# Patient Record
Sex: Female | Born: 2000 | Hispanic: Yes | Marital: Single
Health system: Northeastern US, Academic
[De-identification: ages and names within clinical notes are randomized; demographics above are authoritative.]

---
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
## 2021-05-07 ENCOUNTER — Encounter: Admit: 2021-05-07 | Payer: PRIVATE HEALTH INSURANCE | Attending: Emergency Medicine

## 2021-05-07 ENCOUNTER — Inpatient Hospital Stay: Admit: 2021-05-07 | Discharge: 2021-05-07 | Payer: MEDICAID

## 2021-05-07 LAB — CBC WITH AUTO DIFFERENTIAL
BKR WAM ABSOLUTE IMMATURE GRANULOCYTES.: 0 x 1000/ÂµL (ref 0.00–0.30)
BKR WAM ABSOLUTE LYMPHOCYTE COUNT.: 2.43 x 1000/ÂµL (ref 0.60–3.70)
BKR WAM ABSOLUTE NRBC (2 DEC): 0 x 1000/ÂµL (ref 0.00–1.00)
BKR WAM ANALYZER ANC: 2.4 x 1000/ÂµL (ref 2.00–7.60)
BKR WAM BASOPHIL ABSOLUTE COUNT.: 0.07 x 1000/ÂµL (ref 0.00–1.00)
BKR WAM BASOPHILS: 1.2 % (ref 0.0–1.4)
BKR WAM EOSINOPHIL ABSOLUTE COUNT.: 0.75 x 1000/ÂµL (ref 0.00–1.00)
BKR WAM EOSINOPHILS: 12.5 % — ABNORMAL HIGH (ref 0.0–5.0)
BKR WAM HEMATOCRIT (2 DEC): 42.3 % (ref 35.00–45.00)
BKR WAM HEMOGLOBIN: 13.7 g/dL (ref 11.7–15.5)
BKR WAM IMMATURE GRANULOCYTES: 0 % (ref 0.0–1.0)
BKR WAM LYMPHOCYTES: 40.6 % (ref 17.0–50.0)
BKR WAM MCH (PG): 30.8 pg (ref 27.0–33.0)
BKR WAM MCHC: 32.4 g/dL (ref 31.0–36.0)
BKR WAM MCV: 95.1 fL (ref 80.0–100.0)
BKR WAM MONOCYTE ABSOLUTE COUNT.: 0.33 x 1000/ÂµL (ref 0.00–1.00)
BKR WAM MONOCYTES: 5.5 % (ref 4.0–12.0)
BKR WAM MPV: 11.6 fL (ref 8.0–12.0)
BKR WAM NEUTROPHILS: 40.2 % (ref 39.0–72.0)
BKR WAM NUCLEATED RED BLOOD CELLS: 0 % (ref 0.0–1.0)
BKR WAM PLATELETS: 253 x1000/ÂµL (ref 150–420)
BKR WAM RDW-CV: 13.1 % (ref 11.0–15.0)
BKR WAM RED BLOOD CELL COUNT.: 4.45 M/ÂµL (ref 4.00–6.00)
BKR WAM WHITE BLOOD CELL COUNT: 6 x1000/ÂµL (ref 4.0–11.0)

## 2021-05-07 LAB — BASIC METABOLIC PANEL
BKR ANION GAP: 11 (ref 7–17)
BKR BLOOD UREA NITROGEN: 10 mg/dL (ref 6–20)
BKR BUN / CREAT RATIO: 13 (ref 8.0–23.0)
BKR CALCIUM: 9.4 mg/dL (ref 8.8–10.2)
BKR CHLORIDE: 104 mmol/L (ref 98–107)
BKR CO2: 24 mmol/L (ref 20–30)
BKR CREATININE: 0.77 mg/dL (ref 0.40–1.30)
BKR EGFR, CREATININE (CKD-EPI 2021): >60 mL/min/{1.73_m2} (ref >=60)
BKR GLUCOSE: 91 mg/dL (ref 70–100)
BKR POTASSIUM: 4 mmol/L (ref 3.3–5.3)
BKR SODIUM: 139 mmol/L (ref 136–144)

## 2021-05-07 LAB — HCG, QUANTITATIVE     (BH GH LMW YH): BKR HCG, QUANTITATIVE: <1 m[IU]/mL

## 2021-05-07 MED ORDER — ACETAMINOPHEN 325 MG TABLET
325 mg | Freq: Once | ORAL | Status: CP
Start: 2021-05-07 — End: ?
  Administered 2021-05-07: 16:00:00 325 mg via ORAL

## 2021-05-07 MED ORDER — METRONIDAZOLE 500 MG TABLET
500 mg | ORAL_TABLET | Freq: Three times a day (TID) | ORAL | 1 refills | Status: AC
Start: 2021-05-07 — End: ?

## 2021-05-07 NOTE — ED Triage Note
I evaluated this patient as the Physician in triage and completed a medical screening examination.  Patient will need further care.  Initial care orders will be placed as appropriate.Alexis Goodell, DO9:13 AM

## 2021-05-07 NOTE — ED Notes
10:07 AM Patient here with c/o epigastric pain for about 4 months, now rates her pain at a 9/10. She also not being able to eat for 8 weeks and now vomiting for 5 weeks. Patient states that she gets hungry, puts the food in her mouth, chews it but then she is not able to get it down. She says that when she moves she feels like her organs are twisting. Last vomiting episode was this morning, and she has not been having bowel movements for a while without using a laxative. Patient also endorses a 20 pound weight loss in about one month.

## 2021-05-07 NOTE — ED Notes
10:22 AM Patient states right lower abdominal pain since February but has gotten severe in the last month, she had an IUD placed in February. Patient also endorses white clumpy vaginal discharge and her last sexual encounter was in July. Patient has no urinary complaints, has had nausea but no vomiting. She thinks all this might be due to her IUD and would like it removed

## 2021-05-07 NOTE — Discharge Instructions
Call today and schedule appoint with OBGYN clinic.Return to [facility] Emergency Department if symptoms worsen or any other concerns.Refrain from any sexual activity until symptoms have fully resolved.Over-the-counter ibuprofen as needed for pain.

## 2021-05-07 NOTE — ED Provider Notes
HistoryChief Complaint Patient presents with ? Abdominal Pain   Right sided abd cramps x5 months, worsening over past month, IUD in place, placed 1year ago. +vaginal d/c. +intermittent spotting. +intermittent nausea.  20 year old immunized otherwise healthy female presents to the emergency department complaint of pelvic pain associated with IUD placement.  Patient reports IUD was placed in November of 2022 and she has since had repeated packed your vaginosis and pain.  Patient is requesting the IUD to be removed.  Patient offers no additional medical complaints at this time.The history is provided by the patient. Abdominal PainPain location:  SuprapubicPain quality: cramping  Pain radiates to:  Does not radiatePain severity:  ModerateOnset quality:  Unable to specifyDuration:  8 weeksTiming:  ConstantProgression:  UnchangedChronicity:  NewRelieved by:  NothingWorsened by:  NothingIneffective treatments:  None triedAssociated symptoms: no chills, no fever, no hematuria and no nausea   No past medical history on file.No past surgical history on file.No family history on file.Social History Socioeconomic History ? Marital status: Single Tobacco Use ? Smoking status: Never ? Smokeless tobacco: Never Vaping Use ? Vaping Use: Some days ? Substances: Nicotine Substance and Sexual Activity ? Alcohol use: Not Currently ED Other Social History ? E-cigarette status Current Some Day User  ? E-Cigarette Use Current Some Day User  ? Amphetamine frequency Never used  Never used on 05/07/2021 ? Cannabis frequency Never used  Never used on 05/07/2021 ? Cocaine frequency Never used  Never used on 05/07/2021 ? Ecstasy frequency Never used  Never used on 05/07/2021 ? Hallucinogen frequency Never used  Never used on 05/07/2021 ? Inhalant frequency Never used  Never used on 05/07/2021 ? Opiate frequency Never used  Never used on 05/07/2021 ? Sedative frequency Never used  Never used on 05/07/2021 ? Other drug frequency Never used  Never used on 05/07/2021 E-cigarette/Vaping Substances ? Nicotine Yes  E-cigarette/Vaping Devices Review of Systems Constitutional: Negative for chills and fever. Gastrointestinal: Positive for abdominal pain. Negative for nausea. Genitourinary: Negative for hematuria. All other systems reviewed and are negative. Physical ExamED Triage Vitals [05/07/21 0909]BP: 117/82Pulse: 78Pulse from  O2 sat: n/aResp: 16Temp: 97.5 ?F (36.4 ?C)Temp src: TemporalSpO2: 98 % BP 117/82  - Pulse 78  - Temp 97.5 ?F (36.4 ?C) (Temporal)  - Resp 16  - Wt 53.4 kg (117 lb 11.6 oz)  - SpO2 98% Physical ExamVitals and nursing note reviewed. Constitutional:     Appearance: Normal appearance. She is well-developed. HENT:    Head: Normocephalic and atraumatic.    Right Ear: External ear normal.    Left Ear: External ear normal.    Nose: Nose normal.    Mouth/Throat:    Mouth: Mucous membranes are moist.    Pharynx: Oropharynx is clear. Cardiovascular:    Pulses: Normal pulses.    Heart sounds: Normal heart sounds. Pulmonary:    Effort: Pulmonary effort is normal.    Breath sounds: Normal breath sounds. Abdominal:    General: Bowel sounds are normal. There is no distension.    Palpations: Abdomen is soft. There is no mass.    Tenderness: There is no abdominal tenderness. There is no right CVA tenderness, left CVA tenderness or guarding. Genitourinary:   Comments: DeferredMusculoskeletal:       General: Normal range of motion. Skin:   General: Skin is warm and dry.    Capillary Refill: Capillary refill takes less than 2 seconds. Neurological:    Mental Status: She is alert and oriented to person, place, and time.  ProceduresProcedures ED  COURSEPatient Reevaluation: Patient states she cannot wait any longer and must leave the emergency department.  I discussed strict follow-up with OBGYN with the patient she agrees to call the clinic today to schedule an appointment as soon as possible.  Patient also has been advised to return to the emergency department if symptoms worsen, low back pain or fever develops or any other concerns.  Patient verbalized understanding these instructions.Patient re-evaluated. Pt will be discharged home. I reviewed today's visit in the ED, with Jerral Bonito.  Patient will be discharged home to follow up with OB/GYN in 1 to 2 days. Appropriate symptomatic treatments discussed. Indications to return to [facility] Emergency Department  if symptoms worsen or any other concerns. Patient verbalizes understanding. All questions answered. Advised to call if any further questions.Clinical Impressions as of 05/07/21 1147 Pelvic pain  ED DispositionDischarge Arvil Chaco, APRN12/20/22 1147
